# Patient Record
Sex: Male | Born: 1945 | Race: White | Hispanic: No | Marital: Single
Health system: Southern US, Community
[De-identification: ages and names within clinical notes are randomized; demographics above are authoritative.]

## PROBLEM LIST (undated history)

## (undated) DIAGNOSIS — R569 Unspecified convulsions: Secondary | ICD-10-CM

## (undated) DIAGNOSIS — N400 Enlarged prostate without lower urinary tract symptoms: Secondary | ICD-10-CM

## (undated) DIAGNOSIS — M7989 Other specified soft tissue disorders: Secondary | ICD-10-CM

## (undated) DIAGNOSIS — K219 Gastro-esophageal reflux disease without esophagitis: Secondary | ICD-10-CM

## (undated) DIAGNOSIS — F79 Unspecified intellectual disabilities: Secondary | ICD-10-CM

## (undated) HISTORY — DX: Other specified soft tissue disorders: M79.89

---

## 2004-08-09 ENCOUNTER — Ambulatory Visit: Payer: Self-pay | Admitting: Urology

## 2009-11-25 ENCOUNTER — Inpatient Hospital Stay: Payer: Self-pay | Admitting: Internal Medicine

## 2009-12-26 ENCOUNTER — Emergency Department: Payer: Self-pay | Admitting: Emergency Medicine

## 2011-07-09 ENCOUNTER — Emergency Department: Payer: Self-pay | Admitting: Emergency Medicine

## 2011-10-08 ENCOUNTER — Emergency Department: Payer: Self-pay | Admitting: Internal Medicine

## 2013-10-16 ENCOUNTER — Ambulatory Visit: Payer: Self-pay | Admitting: Podiatry

## 2013-10-22 ENCOUNTER — Encounter: Payer: Self-pay | Admitting: Podiatry

## 2013-10-23 ENCOUNTER — Ambulatory Visit (INDEPENDENT_AMBULATORY_CARE_PROVIDER_SITE_OTHER): Payer: Medicare Other | Admitting: Podiatry

## 2013-10-23 VITALS — BP 134/84 | HR 55 | Resp 16

## 2013-10-23 DIAGNOSIS — B351 Tinea unguium: Secondary | ICD-10-CM

## 2013-10-23 DIAGNOSIS — M79609 Pain in unspecified limb: Secondary | ICD-10-CM

## 2013-10-23 NOTE — Progress Notes (Signed)
Subjective:     Patient ID: Philip Wright, male   DOB: [birth year], 67 y.o.   MRN: [id number]  HPI patient presents stating I need my nails cut. They are thick and pain   Review of Systems     Objective:   Physical Exam Neurovascular status unchanged with health history same and painful nailbeds with thickness 1-5 of both feet    Assessment:     Mycotic infection with painful nailbeds 1-5 both feet    Plan:     Debridement of painful nailbeds 1-5 both feet with no iatrogenic bleeding noted

## 2014-02-19 ENCOUNTER — Ambulatory Visit (INDEPENDENT_AMBULATORY_CARE_PROVIDER_SITE_OTHER): Payer: Medicare Other | Admitting: Podiatry

## 2014-02-19 ENCOUNTER — Encounter: Payer: Self-pay | Admitting: Podiatry

## 2014-02-19 DIAGNOSIS — M79609 Pain in unspecified limb: Secondary | ICD-10-CM

## 2014-02-19 DIAGNOSIS — B351 Tinea unguium: Secondary | ICD-10-CM

## 2014-02-21 NOTE — Progress Notes (Signed)
Subjective:     Patient ID: Philip Wright, male   DOB: 07/22/1946, 68 y.o.   MRN: [id number]  HPI patient presents with caregiver with thick painful nail bed 1-5 both feet that he cannot cut do to his physical limitations   Review of Systems     Objective:   Physical Exam Neurovascular status unchanged with thick brittle painful nail bed 1-5 both feet    Assessment:     Mycotic nail infection with pain 1-5 both feet    Plan:     Debridement of painful nailbeds 1-5 both feet with no bleeding noted

## 2014-06-18 ENCOUNTER — Ambulatory Visit (INDEPENDENT_AMBULATORY_CARE_PROVIDER_SITE_OTHER): Payer: Medicare Other | Admitting: Podiatry

## 2014-06-18 DIAGNOSIS — M79609 Pain in unspecified limb: Secondary | ICD-10-CM

## 2014-06-18 DIAGNOSIS — M79673 Pain in unspecified foot: Secondary | ICD-10-CM

## 2014-06-18 DIAGNOSIS — B351 Tinea unguium: Secondary | ICD-10-CM

## 2014-06-18 NOTE — Progress Notes (Signed)
Subjective:     Patient ID: Philip Wright, male   DOB: 09/11/1946, 68 y.o.   MRN: [id number]  HPI  patient presents with thick nailbeds 1-5 both feet that he cannot cut and is with caregiver   Review of Systems      Objective:   Physical Exam  Neurovascular status unchanged with thick yellow brittle nailbeds 1-5 both feet that are painful    Assessment:     Mycotic nail infection is with pain 1-5 both feet    Plan:     Debride painful nailbeds 1-5 both feet with no iatrogenic bleeding noted      

## 2014-09-17 ENCOUNTER — Ambulatory Visit (INDEPENDENT_AMBULATORY_CARE_PROVIDER_SITE_OTHER): Payer: Medicare Other | Admitting: Podiatry

## 2014-09-17 DIAGNOSIS — M79673 Pain in unspecified foot: Secondary | ICD-10-CM

## 2014-09-17 DIAGNOSIS — B351 Tinea unguium: Secondary | ICD-10-CM

## 2014-09-17 NOTE — Progress Notes (Signed)
Subjective:     Patient ID: Philip Wright, male   DOB: 07/15/1946, 68 y.o.   MRN: [id number]  HPI  patient presents with thick nailbeds 1-5 both feet that he cannot cut and is with caregiver   Review of Systems      Objective:   Physical Exam  Neurovascular status unchanged with thick yellow brittle nailbeds 1-5 both feet that are painful    Assessment:     Mycotic nail infection is with pain 1-5 both feet    Plan:     Debride painful nailbeds 1-5 both feet with no iatrogenic bleeding noted      

## 2014-09-17 NOTE — Progress Notes (Signed)
Subjective:     Patient ID: Philip Wright, male   DOB: 01/23/1946, 68 y.o.   MRN: [id number]  HPI  patient presents with thick nailbeds 1-5 both feet that he cannot cut and is with caregiver   Review of Systems      Objective:   Physical Exam  Neurovascular status unchanged with thick yellow brittle nailbeds 1-5 both feet that are painful    Assessment:     Mycotic nail infection is with pain 1-5 both feet    Plan:     Debride painful nailbeds 1-5 both feet with no iatrogenic bleeding noted

## 2014-12-10 ENCOUNTER — Emergency Department: Payer: Self-pay | Admitting: Emergency Medicine

## 2014-12-10 LAB — URINALYSIS, COMPLETE
BILIRUBIN, UR: NEGATIVE
Blood: NEGATIVE
Glucose,UR: NEGATIVE mg/dL (ref 0–75)
Ketone: NEGATIVE
Nitrite: NEGATIVE
Ph: 5 (ref 4.5–8.0)
Protein: 30
RBC,UR: 27 /HPF (ref 0–5)
Specific Gravity: 1.015 (ref 1.003–1.030)
WBC UR: 437 /HPF (ref 0–5)

## 2014-12-10 LAB — COMPREHENSIVE METABOLIC PANEL
ALK PHOS: 56 U/L (ref 46–116)
ALT: 23 U/L (ref 14–63)
Albumin: 3.5 g/dL (ref 3.4–5.0)
Anion Gap: 4 — ABNORMAL LOW (ref 7–16)
BILIRUBIN TOTAL: 0.3 mg/dL (ref 0.2–1.0)
BUN: 15 mg/dL (ref 7–18)
CREATININE: 0.77 mg/dL (ref 0.60–1.30)
Calcium, Total: 8.9 mg/dL (ref 8.5–10.1)
Chloride: 105 mmol/L (ref 98–107)
Co2: 30 mmol/L (ref 21–32)
EGFR (Non-African Amer.): >60
GLUCOSE: 98 mg/dL (ref 65–99)
Osmolality: 278 (ref 275–301)
Potassium: 3.9 mmol/L (ref 3.5–5.1)
SGOT(AST): 28 U/L (ref 15–37)
Sodium: 139 mmol/L (ref 136–145)
TOTAL PROTEIN: 7.2 g/dL (ref 6.4–8.2)

## 2014-12-10 LAB — CBC
HCT: 38 % — AB (ref 40.0–52.0)
HGB: 13.3 g/dL (ref 13.0–18.0)
MCH: 33.9 pg (ref 26.0–34.0)
MCHC: 34.9 g/dL (ref 32.0–36.0)
MCV: 97 fL (ref 80–100)
Platelet: 244 10*3/uL (ref 150–440)
RBC: 3.92 10*6/uL — AB (ref 4.40–5.90)
RDW: 12.8 % (ref 11.5–14.5)
WBC: 5.7 10*3/uL (ref 3.8–10.6)

## 2014-12-13 LAB — URINE CULTURE

## 2014-12-17 ENCOUNTER — Other Ambulatory Visit: Payer: Medicare Other

## 2014-12-21 ENCOUNTER — Ambulatory Visit (INDEPENDENT_AMBULATORY_CARE_PROVIDER_SITE_OTHER): Payer: Medicare Other | Admitting: Podiatry

## 2014-12-21 DIAGNOSIS — B351 Tinea unguium: Secondary | ICD-10-CM

## 2014-12-21 DIAGNOSIS — M79673 Pain in unspecified foot: Secondary | ICD-10-CM

## 2014-12-21 NOTE — Progress Notes (Signed)
Subjective:     Patient ID: Philip Wright, male   DOB: 01/07/1946, 69 y.o.   MRN: [id number]  HPI  patient presents with thick nailbeds 1-5 both feet that he cannot cut and is with caregiver   Review of Systems      Objective:   Physical Exam  Neurovascular status unchanged with thick yellow brittle nailbeds 1-5 both feet that are painful    Assessment:     Mycotic nail infection is with pain 1-5 both feet    Plan:     Debride painful nailbeds 1-5 both feet with no iatrogenic bleeding noted      

## 2015-06-02 ENCOUNTER — Ambulatory Visit (INDEPENDENT_AMBULATORY_CARE_PROVIDER_SITE_OTHER): Payer: Medicare Other | Admitting: Podiatry

## 2015-06-02 DIAGNOSIS — M79673 Pain in unspecified foot: Secondary | ICD-10-CM | POA: Diagnosis not present

## 2015-06-02 DIAGNOSIS — B351 Tinea unguium: Secondary | ICD-10-CM

## 2015-06-02 NOTE — Progress Notes (Signed)
Subjective: 69 y.o. returns the office today for painful, elongated, thickened toenails which he is unable to trim himself. Denies any redness or drainage around the nails. Denies any acute changes since last appointment and no new complaints today. Denies any systemic complaints such as fevers, chills, nausea, vomiting.   Objective: Awake, alert, NAD; presents with caregiver DP/PT pulses palpable, CRT less than 3 seconds Nails hypertrophic, dystrophic, elongated, brittle, discolored 10. There is tenderness overlying the nails 1-5 bilaterally. There is no surrounding erythema or drainage along the nail sites. No open lesions or pre-ulcerative lesions are identified. No other areas of tenderness bilateral lower extremities. No overlying edema, erythema, increased warmth. No pain with calf compression, swelling, warmth, erythema.  Assessment: Patient presents with symptomatic onychomycosis  Plan: -Treatment options including alternatives, risks, complications were discussed -Nails sharply debrided 10 without complication/bleeding. -Discussed daily foot inspection. If there are any changes, to call the office immediately.  -Follow-up in 3 months or sooner if any problems are to arise. In the meantime, encouraged to call the office with any questions, concerns, changes symptoms.   Ovid Curd, DPM

## 2015-09-08 ENCOUNTER — Ambulatory Visit (INDEPENDENT_AMBULATORY_CARE_PROVIDER_SITE_OTHER): Payer: Medicare Other | Admitting: Podiatry

## 2015-09-08 DIAGNOSIS — B351 Tinea unguium: Secondary | ICD-10-CM

## 2015-09-08 DIAGNOSIS — M79673 Pain in unspecified foot: Secondary | ICD-10-CM | POA: Diagnosis not present

## 2015-09-08 NOTE — Progress Notes (Signed)
Subjective: 69 y.o. returns the office today for painful, elongated, thickened toenails which he is unable to trim himself. Denies any redness or drainage around the nails. Denies any acute changes since last appointment and no new complaints today. Denies any systemic complaints such as fevers, chills, nausea, vomiting.   Objective: NAD; presents with caregiver DP/PT pulses palpable, CRT less than 3 seconds Nails hypertrophic, dystrophic, elongated, brittle, discolored 10. There is tenderness overlying the nails 1-5 bilaterally. There is no surrounding erythema or drainage along the nail sites. No open lesions or pre-ulcerative lesions are identified. No other areas of tenderness bilateral lower extremities. No overlying edema, erythema, increased warmth. No pain with calf compression, swelling, warmth, erythema.  Assessment: Patient presents with symptomatic onychomycosis  Plan: -Treatment options including alternatives, risks, complications were discussed -Nails sharply debrided 10 without complication/bleeding. -Discussed daily foot inspection. If there are any changes, to call the office immediately.  -Follow-up in 3 months or sooner if any problems are to arise. In the meantime, encouraged to call the office with any questions, concerns, changes symptoms. 3 mo.  Ovid CurdMatthew Wagoner, DPM

## 2015-10-04 ENCOUNTER — Emergency Department: Payer: Medicare Other

## 2015-10-04 ENCOUNTER — Encounter: Payer: Self-pay | Admitting: Emergency Medicine

## 2015-10-04 ENCOUNTER — Emergency Department
Admission: EM | Admit: 2015-10-04 | Discharge: 2015-10-04 | Disposition: A | Discharge status: Home / Self Care | Payer: Medicare Other | Attending: Student | Admitting: Student

## 2015-10-04 DIAGNOSIS — Q541 Hypospadias, penile: Secondary | ICD-10-CM | POA: Diagnosis not present

## 2015-10-04 DIAGNOSIS — Z880 Allergy status to penicillin: Secondary | ICD-10-CM | POA: Insufficient documentation

## 2015-10-04 DIAGNOSIS — R109 Unspecified abdominal pain: Secondary | ICD-10-CM | POA: Diagnosis present

## 2015-10-04 DIAGNOSIS — N320 Bladder-neck obstruction: Secondary | ICD-10-CM | POA: Diagnosis not present

## 2015-10-04 DIAGNOSIS — I10 Essential (primary) hypertension: Secondary | ICD-10-CM | POA: Insufficient documentation

## 2015-10-04 DIAGNOSIS — Z79899 Other long term (current) drug therapy: Secondary | ICD-10-CM | POA: Insufficient documentation

## 2015-10-04 DIAGNOSIS — R1084 Generalized abdominal pain: Secondary | ICD-10-CM | POA: Diagnosis not present

## 2015-10-04 HISTORY — DX: Benign prostatic hyperplasia without lower urinary tract symptoms: N40.0

## 2015-10-04 HISTORY — DX: Gastro-esophageal reflux disease without esophagitis: K21.9

## 2015-10-04 HISTORY — DX: Unspecified intellectual disabilities: F79

## 2015-10-04 HISTORY — DX: Unspecified convulsions: R56.9

## 2015-10-04 LAB — URINALYSIS COMPLETE WITH MICROSCOPIC (ARMC ONLY)
BACTERIA UA: NONE SEEN
Bilirubin Urine: NEGATIVE
GLUCOSE, UA: NEGATIVE mg/dL
HGB URINE DIPSTICK: NEGATIVE
Ketones, ur: NEGATIVE mg/dL
NITRITE: NEGATIVE
PH: 5 (ref 5.0–8.0)
Protein, ur: NEGATIVE mg/dL
SPECIFIC GRAVITY, URINE: 1.019 (ref 1.005–1.030)

## 2015-10-04 LAB — COMPREHENSIVE METABOLIC PANEL
ALT: 20 U/L (ref 17–63)
ANION GAP: 7 (ref 5–15)
AST: 33 U/L (ref 15–41)
Albumin: 4.2 g/dL (ref 3.5–5.0)
Alkaline Phosphatase: 49 U/L (ref 38–126)
BUN: 16 mg/dL (ref 6–20)
CHLORIDE: 106 mmol/L (ref 101–111)
CO2: 26 mmol/L (ref 22–32)
Calcium: 8.9 mg/dL (ref 8.9–10.3)
Creatinine, Ser: 0.78 mg/dL (ref 0.61–1.24)
GFR calc non Af Amer: >60 mL/min (ref >60)
Glucose, Bld: 148 mg/dL — ABNORMAL HIGH (ref 65–99)
POTASSIUM: 3.7 mmol/L (ref 3.5–5.1)
SODIUM: 139 mmol/L (ref 135–145)
Total Bilirubin: 0.2 mg/dL — ABNORMAL LOW (ref 0.3–1.2)
Total Protein: 7 g/dL (ref 6.5–8.1)

## 2015-10-04 LAB — CBC WITH DIFFERENTIAL/PLATELET
Basophils Absolute: 0 10*3/uL (ref 0–0.1)
Basophils Relative: 1 %
EOS ABS: 0.2 10*3/uL (ref 0–0.7)
EOS PCT: 3 %
HCT: 37.3 % — ABNORMAL LOW (ref 40.0–52.0)
Hemoglobin: 12.9 g/dL — ABNORMAL LOW (ref 13.0–18.0)
LYMPHS ABS: 1.6 10*3/uL (ref 1.0–3.6)
Lymphocytes Relative: 26 %
MCH: 33.2 pg (ref 26.0–34.0)
MCHC: 34.7 g/dL (ref 32.0–36.0)
MCV: 95.6 fL (ref 80.0–100.0)
MONOS PCT: 12 %
Monocytes Absolute: 0.7 10*3/uL (ref 0.2–1.0)
Neutro Abs: 3.5 10*3/uL (ref 1.4–6.5)
Neutrophils Relative %: 58 %
PLATELETS: 212 10*3/uL (ref 150–440)
RBC: 3.9 MIL/uL — ABNORMAL LOW (ref 4.40–5.90)
RDW: 13.1 % (ref 11.5–14.5)
WBC: 6.1 10*3/uL (ref 3.8–10.6)

## 2015-10-04 LAB — PHENYTOIN LEVEL, TOTAL: Phenytoin Lvl: 14.9 ug/mL (ref 10.0–20.0)

## 2015-10-04 LAB — TROPONIN I

## 2015-10-04 LAB — LIPASE, BLOOD: Lipase: 23 U/L (ref 11–51)

## 2015-10-04 MED ORDER — BARIUM SULFATE 2.1 % PO SUSP
900.0000 mL | Freq: Once | ORAL | Status: AC
  Administered 2015-10-04: 900 mL via ORAL

## 2015-10-04 MED ORDER — SODIUM CHLORIDE 0.9 % IV BOLUS (SEPSIS)
500.0000 mL | Freq: Once | INTRAVENOUS | Status: AC
  Administered 2015-10-04: 500 mL via INTRAVENOUS

## 2015-10-04 MED ORDER — IOHEXOL 240 MG/ML SOLN: 25.0000 mL | Freq: Once | INTRAMUSCULAR | Status: DC | PRN

## 2015-10-04 NOTE — ED Notes (Signed)
Pt caregivers state his last void today is unknown.

## 2015-10-04 NOTE — ED Notes (Signed)
Pt discharged to group home with caregivers.  Pt went home with [id number] foley.  Discharge teaching done with caregivers, including proper foley care.  Voiced understanding.  No questions or concerns at this time.  All items with pt upon discharge.  No items left in room.

## 2015-10-04 NOTE — ED Notes (Signed)
Bladder scan approximately 374 mL.

## 2015-10-04 NOTE — ED Notes (Signed)
Pt here via [facility] with c/o LLQ abdominal pain. EMS reports pt with history of kidney stones, pt poor historian due to mental retardation. Pt able to answer yes and no questions, pt guarding left side of abdomen.

## 2015-10-04 NOTE — ED Notes (Signed)
In and out catheter returned approximately 400 mL.

## 2015-10-04 NOTE — ED Provider Notes (Signed)
[facility] Emergency Department Provider Note  ____________________________________________  Time seen: Approximately 3:09 PM  I have reviewed the triage vital signs and the nursing notes.   HISTORY  Chief Complaint Abdominal Pain  Caveat-history of present illness and review of systems limited secondary to the patient's severe intellectual disability. Information obtained from patient's family and caregivers who are at bedside.  HPI Philip Wright is a 69 y.o. male with history of severe intellectual disability, seizures, HTN, enlarged prostate, nephrolithiasis, frequent urinary tract infections who presents for evaluation of perceived left sided abdominal pain today. Caregivers report that the patient was at a day care program when he developed left-sided abdominal pain and was doubled over in pain. He has had no vomiting or fevers, no cough, no recent falls. Up until today, he had been in his usual state of health.   Past Medical History  Diagnosis Date  . Bilateral swelling of feet   . Mental retardation   . Seizures (HCC)   . Esophageal reflux   . Enlarged prostate     There are no active problems to display for this patient.   No past surgical history on file.  Current Outpatient Rx  Name  Route  Sig  Dispense  Refill  . acetaminophen (TYLENOL) 325 MG tablet   Oral   Take 325 mg by mouth every 6 (six) hours as needed for mild pain, moderate pain, fever or headache.          . ipratropium (ATROVENT) 0.03 % nasal spray   Each Nare   Place 2 sprays into both nostrils 2 (two) times daily.         Marland Kitchen lovastatin (MEVACOR) 20 MG tablet   Oral   Take 20 mg by mouth daily with supper.         . Multiple Vitamins-Minerals (SUPER THERA VITE M PO)   Oral   Take 1 tablet by mouth daily.          . nitrofurantoin, macrocrystal-monohydrate, (MACROBID) 100 MG capsule   Oral   Take 100 mg by mouth daily.         Marland Kitchen omeprazole (PRILOSEC) 20 MG  capsule   Oral   Take 20 mg by mouth every morning.          . phenytoin (DILANTIN) 100 MG ER capsule   Oral   Take 300 mg by mouth at bedtime.          . propranolol (INDERAL) 40 MG tablet   Oral   Take 40 mg by mouth 3 (three) times daily.         . Skin Protectants, Misc. (ALOE VESTA PROTECTIVE EX)   Apply externally   Apply 1 application topically daily.         . tamsulosin (FLOMAX) 0.4 MG CAPS capsule   Oral   Take 0.4 mg by mouth every evening.          . Vitamin D, Ergocalciferol, (DRISDOL) 50000 UNITS CAPS capsule   Oral   Take 50,000 Units by mouth every 14 (fourteen) days. In the evening On Friday           Allergies Amoxicillin; Furoxone; Iodine; Shellfish allergy; and Zantac  No family history on file.  Social History Social History  Substance Use Topics  . Smoking status: Unknown If Ever Smoked  . Smokeless tobacco: None  . Alcohol Use: No    Review of Systems Constitutional: No fever/chills Gastrointestinal: + abdominal pain.  No nausea, no vomiting.  No diarrhea.    Caveat-history of present illness and review of systems limited secondary to the patient's severe intellectual disability. Information obtained from patient's family and caregivers who are at bedside. ____________________________________________   PHYSICAL EXAM:  VITAL SIGNS: ED Triage Vitals  Enc Vitals Group     BP 10/04/15 1448 129/67 mmHg     Pulse Rate 10/04/15 1448 72     Resp 10/04/15 1448 20     Temp 10/04/15 1448 98.1 F (36.7 C)     Temp Source 10/04/15 1448 Oral     SpO2 10/04/15 1448 95 %     Weight 10/04/15 1448 170 lb (77.111 kg)     Height --      Head Cir --      Peak Flow --      Pain Score --      Pain Loc --      Pain Edu? --      Excl. in GC? --     Constitutional: Alert, smiling, and dorsal disability limits the history taking but the patient is able to follow commands and participate in the physical examination. He is well-appearing  and in no acute distress. Eyes: Conjunctivae are normal. PERRL. EOMI. Head: Atraumatic. Nose: No congestion/rhinnorhea. Mouth/Throat: Mucous membranes are moist.  Oropharynx non-erythematous. Neck: No stridor.   Cardiovascular: Normal rate, regular rhythm. Grossly normal heart sounds.  Good peripheral circulation. Respiratory: Normal respiratory effort.  No retractions. Lungs CTAB. Gastrointestinal: Soft with mild diffuse tenderness as well as guarding. No CVA tenderness. Genitourinary: + penile hypospadias Musculoskeletal: No lower extremity tenderness nor edema.  No joint effusions. Neurologic:  Normal speech and language given considering intellecutal disability. No gross focal neurologic deficits are appreciated. Skin:  Skin is warm, dry and intact. No rash noted. Psychiatric: Mood and affect are normal. Speech and behavior are normal.  ____________________________________________   LABS (all labs ordered are listed, but only abnormal results are displayed)  Labs Reviewed  CBC WITH DIFFERENTIAL/PLATELET - Abnormal; Notable for the following:    RBC 3.90 (*)    Hemoglobin 12.9 (*)    HCT 37.3 (*)    All other components within normal limits  COMPREHENSIVE METABOLIC PANEL - Abnormal; Notable for the following:    Glucose, Bld 148 (*)    Total Bilirubin 0.2 (*)    All other components within normal limits  URINALYSIS COMPLETEWITH MICROSCOPIC ([facility] ONLY) - Abnormal; Notable for the following:    Color, Urine YELLOW (*)    APPearance HAZY (*)    Leukocytes, UA TRACE (*)    Squamous Epithelial / LPF 0-5 (*)    All other components within normal limits  URINE CULTURE  LIPASE, BLOOD  TROPONIN I  PHENYTOIN LEVEL, TOTAL   ____________________________________________  EKG  ED ECG REPORT I, Gayla Doss, the attending physician, personally viewed and interpreted this ECG.   Date: 10/04/2015  EKG Time: 14:50  Rate: 76  Rhythm: normal sinus rhythm  Axis: normal   Intervals:nonspecific intraventricular conduction delay  ST&T Change: No acute ST elevation.   ____________________________________________  RADIOLOGY  CXR IMPRESSION: Moderately limited study showing heart size that is upper normal. No acute findings appreciated   CT  Abdomen and Pelvis IMPRESSION: 1. No acute finding. 2. Marked bladder wall thickening, favor chronic outlet obstruction. Please correlate with urinalysis. 3. Extensive colonic diverticulosis. ____________________________________________   PROCEDURES  Procedure(s) performed: None  Critical Care performed: No  ____________________________________________   INITIAL IMPRESSION /  ASSESSMENT AND PLAN / ED COURSE  Pertinent labs & imaging results that were available during my care of the patient were reviewed by me and considered in my medical decision making (see chart for details).  Philip Wright is a 69 y.o. male with history of severe intellectual disability, seizures, HTN, enlarged prostate, nephrolithiasis, frequent urinary tract infections who presents for evaluation of perceived left sided abdominal pain today. On exam, he is nontoxic appearing and in no acute distress. Vital signs stable, he is afebrile. He does have some degree of tenderness and guarding throughout the abdomen. Plan for abdominal pain labs, urinalysis, will bladder scan, likely obtain CT of the abdomen and pelvis. We'll reassess for disposition.  ----------------------------------------- 8:31 PM on 10/04/2015 ----------------------------------------- Patient continues to appear well, he has had no recurrence of pain since arrival to the emergency department. Labs reviewed and are notable for mild anemia with hemoglobin 12.9. Normal CMP and lipase. Negative troponin, and normal Dilantin levels. Urinalysis with multiple white blood cells however no bacteria. CT scan shows bladder wall thickening which favors chronic outlet obstruction.  Discussed with his caregiver at bedside/guardian that we could preferentially place a Foley catheter as he may be expensive bladder outlet obstruction. In the catheter performed earlier in the day revealed less than 400 cc in the bladder. His guardian and caregivers at bedside are hesitant for this to be done as they state that he will likely pull the catheter out himself due to discomfort. Discussed that we would attempt trial of void in the ER and if he is able to void, send him home with expedient urology follow-up. Will not treat for urinary tract infection but we'll send urine culture.  ----------------------------------------- 9:17 PM on 10/04/2015 ----------------------------------------- Unable to void. Family has agreed to Foley. We'll place Foley and DC with return precautions and close urology follow-up. Discussed return precautions with care providers and guardian and all are comfortable with the discharge plan.  ____________________________________________   FINAL CLINICAL IMPRESSION(S) / ED DIAGNOSES  Final diagnoses:  Generalized abdominal pain  Bladder outlet obstruction      Gayla DossEryka A Milton Streicher, MD 10/05/15 [phone]

## 2015-10-08 LAB — URINE CULTURE

## 2015-11-01 ENCOUNTER — Emergency Department
Admission: EM | Admit: 2015-11-01 | Discharge: 2015-11-01 | Disposition: A | Discharge status: Home / Self Care | Payer: Medicare Other | Attending: Emergency Medicine | Admitting: Emergency Medicine

## 2015-11-01 ENCOUNTER — Emergency Department: Payer: Medicare Other

## 2015-11-01 DIAGNOSIS — A084 Viral intestinal infection, unspecified: Secondary | ICD-10-CM

## 2015-11-01 DIAGNOSIS — Z79899 Other long term (current) drug therapy: Secondary | ICD-10-CM | POA: Insufficient documentation

## 2015-11-01 DIAGNOSIS — J029 Acute pharyngitis, unspecified: Secondary | ICD-10-CM | POA: Insufficient documentation

## 2015-11-01 DIAGNOSIS — Z880 Allergy status to penicillin: Secondary | ICD-10-CM | POA: Insufficient documentation

## 2015-11-01 DIAGNOSIS — R1031 Right lower quadrant pain: Secondary | ICD-10-CM | POA: Diagnosis present

## 2015-11-01 DIAGNOSIS — R05 Cough: Secondary | ICD-10-CM | POA: Diagnosis not present

## 2015-11-01 LAB — COMPREHENSIVE METABOLIC PANEL
ALBUMIN: 4.2 g/dL (ref 3.5–5.0)
ALK PHOS: 55 U/L (ref 38–126)
ALT: 16 U/L — ABNORMAL LOW (ref 17–63)
ANION GAP: 8 (ref 5–15)
AST: 26 U/L (ref 15–41)
BILIRUBIN TOTAL: 0.6 mg/dL (ref 0.3–1.2)
BUN: 13 mg/dL (ref 6–20)
CHLORIDE: 101 mmol/L (ref 101–111)
CO2: 23 mmol/L (ref 22–32)
Calcium: 9.1 mg/dL (ref 8.9–10.3)
Creatinine, Ser: 0.6 mg/dL — ABNORMAL LOW (ref 0.61–1.24)
GFR calc non Af Amer: >60 mL/min (ref >60)
Glucose, Bld: 124 mg/dL — ABNORMAL HIGH (ref 65–99)
POTASSIUM: 3.9 mmol/L (ref 3.5–5.1)
Sodium: 132 mmol/L — ABNORMAL LOW (ref 135–145)
TOTAL PROTEIN: 7 g/dL (ref 6.5–8.1)

## 2015-11-01 LAB — LIPASE, BLOOD: Lipase: 14 U/L (ref 11–51)

## 2015-11-01 LAB — CBC
HEMATOCRIT: 34.6 % — AB (ref 40.0–52.0)
HEMOGLOBIN: 12.3 g/dL — AB (ref 13.0–18.0)
MCH: 33.8 pg (ref 26.0–34.0)
MCHC: 35.6 g/dL (ref 32.0–36.0)
MCV: 94.7 fL (ref 80.0–100.0)
Platelets: 216 10*3/uL (ref 150–440)
RBC: 3.65 MIL/uL — AB (ref 4.40–5.90)
RDW: 13.2 % (ref 11.5–14.5)
WBC: 5.9 10*3/uL (ref 3.8–10.6)

## 2015-11-01 LAB — URINALYSIS COMPLETE WITH MICROSCOPIC (ARMC ONLY)
Bacteria, UA: NONE SEEN
Bilirubin Urine: NEGATIVE
Glucose, UA: NEGATIVE mg/dL
HGB URINE DIPSTICK: NEGATIVE
LEUKOCYTES UA: NEGATIVE
NITRITE: NEGATIVE
PH: 6 (ref 5.0–8.0)
PROTEIN: NEGATIVE mg/dL
SPECIFIC GRAVITY, URINE: 1.012 (ref 1.005–1.030)

## 2015-11-01 MED ORDER — DICYCLOMINE HCL 20 MG PO TABS: 20.0000 mg | ORAL_TABLET | Freq: Three times a day (TID) | ORAL | Status: AC | PRN

## 2015-11-01 MED ORDER — HYDROMORPHONE HCL 1 MG/ML IJ SOLN
0.5000 mg | Freq: Once | INTRAMUSCULAR | Status: AC
  Administered 2015-11-01: 0.5 mg via INTRAVENOUS
  Filled 2015-11-01: qty 1

## 2015-11-01 MED ORDER — SODIUM CHLORIDE 0.9 % IV BOLUS (SEPSIS)
1000.0000 mL | Freq: Once | INTRAVENOUS | Status: AC
  Administered 2015-11-01: 1000 mL via INTRAVENOUS

## 2015-11-01 MED ORDER — BARIUM SULFATE 2.1 % PO SUSP
900.0000 mL | Freq: Once | ORAL | Status: AC
  Administered 2015-11-01: 900 mL via ORAL

## 2015-11-01 MED ORDER — ONDANSETRON 8 MG PO TBDP: 8.0000 mg | ORAL_TABLET | Freq: Three times a day (TID) | ORAL | Status: AC | PRN

## 2015-11-01 NOTE — ED Provider Notes (Signed)
-----------------------------------------   11:07 PM on 11/01/2015 -----------------------------------------   Blood pressure 142/77, pulse 72, temperature 97.9 F (36.6 C), temperature source Oral, resp. rate 18, height 5\' 7"  (1.702 m), weight 195 lb (88.451 kg), SpO2 96 %.  Assuming care from Dr. Scotty CourtStafford.  In short, Philip Wright is a 69 y.o. male with a chief complaint of Abdominal Pain; Emesis; and Diarrhea .  Refer to the original H&P for additional details.  The current plan of care is to follow the patient's abdominal CT results. There appears to be no acute findings. The plan is to discharge the patient for viral gastroenteritis EXAM: CT ABDOMEN AND PELVIS WITHOUT CONTRAST  TECHNIQUE: Multidetector CT imaging of the abdomen and pelvis was performed following the standard protocol without IV contrast.  COMPARISON: 10/04/2015  FINDINGS: Lung bases demonstrate some mild atelectatic changes without focal infiltrate.  The liver, gallbladder, spleen, adrenal glands and pancreas are within normal limits. The kidneys are well visualized bilaterally and reveal some cystic changes. No obstructive changes are seen. The ureters are within normal limits. No renal calculi are noted.  The bladder is well distended. No pelvic mass lesion is seen. No inflammatory changes to suggest appendicitis are noted. Aortoiliac calcifications are seen without aneurysmal dilatation. Scattered diverticular changes are noted. Bony structures are stable in appearance from the prior study.  IMPRESSION: No significant interval change from the prior exam is noted. No acute abnormality is seen.   Philip MoccasinBrian S Jaziah Goeller, MD 11/01/15 [phone]

## 2015-11-01 NOTE — Discharge Instructions (Signed)

## 2015-11-01 NOTE — ED Provider Notes (Signed)
[facility] Emergency Department Provider Note  ____________________________________________  Time seen: 8:05 PM  I have reviewed the triage vital signs and the nursing notes.   HISTORY  Chief Complaint Abdominal Pain; Emesis; and Diarrhea    HPI Philip Wright is a 69 y.o. male is brought to the ED for nausea vomiting and diarrhea that started today along with right-sided abdominal pain. The patient is holding his right flank and right lower quadrant and grimacing in pain. He is nonverbal at baseline due to an intellectual disability. He presents with a representative from his group home where he normally does during the day. He reports that they've had an outbreak of viral illnesses in the group home recently and that there've been several cases of URI as well as vomiting and diarrhea symptoms. This patient had a URI with cough and runny nose a week ago and just as the symptoms seem to be resolving he developed the abdominal pain nausea vomiting and diarrhea today. He's also had decreased oral intake. No known surgeries.  Has a history of enlarged prostate and urinary tract infection   Past Medical History  Diagnosis Date  . Bilateral swelling of feet   . Mental retardation   . Seizures (HCC)   . Esophageal reflux   . Enlarged prostate      There are no active problems to display for this patient.    No past surgical history on file.   Current Outpatient Rx  Name  Route  Sig  Dispense  Refill  . acetaminophen (TYLENOL) 325 MG tablet   Oral   Take 325 mg by mouth every 6 (six) hours as needed for mild pain, moderate pain, fever or headache.          . ipratropium (ATROVENT) 0.03 % nasal spray   Each Nare   Place 2 sprays into both nostrils 2 (two) times daily.         Marland Kitchen lovastatin (MEVACOR) 20 MG tablet   Oral   Take 20 mg by mouth daily with supper.         . Multiple Vitamins-Minerals (SUPER THERA VITE M PO)   Oral   Take 1 tablet  by mouth daily.          . nitrofurantoin, macrocrystal-monohydrate, (MACROBID) 100 MG capsule   Oral   Take 100 mg by mouth daily.         Marland Kitchen omeprazole (PRILOSEC) 20 MG capsule   Oral   Take 20 mg by mouth every morning.          . phenytoin (DILANTIN) 100 MG ER capsule   Oral   Take 300 mg by mouth at bedtime.          . propranolol (INDERAL) 40 MG tablet   Oral   Take 40 mg by mouth 3 (three) times daily.         . Skin Protectants, Misc. (ALOE VESTA PROTECTIVE EX)   Apply externally   Apply 1 application topically daily.         . tamsulosin (FLOMAX) 0.4 MG CAPS capsule   Oral   Take 0.4 mg by mouth every evening.          . Vitamin D, Ergocalciferol, (DRISDOL) 50000 UNITS CAPS capsule   Oral   Take 50,000 Units by mouth every 14 (fourteen) days. In the evening On Friday         . dicyclomine (BENTYL) 20 MG tablet   Oral  Take 1 tablet (20 mg total) by mouth 3 (three) times daily as needed for spasms.   30 tablet   0   . ondansetron (ZOFRAN ODT) 8 MG disintegrating tablet   Oral   Take 1 tablet (8 mg total) by mouth every 8 (eight) hours as needed for nausea or vomiting.   20 tablet   0      Allergies Amoxicillin; Furoxone; Iodine; Shellfish allergy; and Zantac   No family history on file.  Social History Social History  Substance Use Topics  . Smoking status: Unknown If Ever Smoked  . Smokeless tobacco: None  . Alcohol Use: No    Review of Systems  Constitutional:   No fever or chills. No weight changes Eyes:   No blurry vision or double vision.  ENT:   Positive sore throat. Cardiovascular:   No chest pain. Respiratory:   No dyspnea positive cough. Gastrointestinal:   Positive for abdominal pain, with vomiting and diarrhea.  No BRBPR or melena. Genitourinary:   Negative for dysuria, urinary retention, bloody urine, or difficulty urinating. Musculoskeletal:   Negative for back pain. No joint swelling or pain. Skin:   Negative  for rash. Neurological:   Negative for headaches, focal weakness or numbness. Psychiatric:  No anxiety or depression.   Endocrine:  No hot/cold intolerance, changes in energy, or sleep difficulty.  10-point ROS otherwise negative.  ____________________________________________   PHYSICAL EXAM:  VITAL SIGNS: ED Triage Vitals  Enc Vitals Group     BP 11/01/15 1622 142/77 mmHg     Pulse Rate 11/01/15 1622 72     Resp 11/01/15 1622 18     Temp 11/01/15 1622 97.9 F (36.6 C)     Temp Source 11/01/15 1622 Oral     SpO2 11/01/15 1622 96 %     Weight 11/01/15 1622 195 lb (88.451 kg)     Height 11/01/15 1622 5\' 7"  (1.702 m)     Head Cir --      Peak Flow --      Pain Score --      Pain Loc --      Pain Edu? --      Excl. in GC? --     Vital signs reviewed, nursing assessments reviewed.   Constitutional:   Alert , baseline mental status. Well appearing and in no distress. Eyes:   No scleral icterus. No conjunctival pallor. PERRL. EOMI ENT   Head:   Normocephalic and atraumatic.   Nose:   No congestion/rhinnorhea. No septal hematoma   Mouth/Throat:   Dry mucous membranes, no pharyngeal erythema. No peritonsillar mass. No uvula shift.   Neck:   No stridor. No SubQ emphysema. No meningismus. Hematological/Lymphatic/Immunilogical:   No cervical lymphadenopathy. Cardiovascular:   RRR. Normal and symmetric distal pulses are present in all extremities. No murmurs, rubs, or gallops. Respiratory:   Normal respiratory effort without tachypnea nor retractions. Breath sounds are clear and equal bilaterally. No wheezes/rales/rhonchi. Gastrointestinal:   Soft with bilateral lower quadrant tenderness.. No distention. There is no CVA tenderness.  No rebound, rigidity, or guarding. Genitourinary:   deferred Musculoskeletal:   Nontender with normal range of motion in all extremities. No joint effusions.  No lower extremity tenderness.  No edema. Neurologic:   Baseline speech was due  to his unintelligible and repetitive.  CN 2-10 normal. Motor grossly intact. No gross focal neurologic deficits are appreciated.  Skin:    Skin is warm, dry and intact. No rash noted.  No  petechiae, purpura, or bullae. ____________________________________________    LABS (pertinent positives/negatives) (all labs ordered are listed, but only abnormal results are displayed) Labs Reviewed  COMPREHENSIVE METABOLIC PANEL - Abnormal; Notable for the following:    Sodium 132 (*)    Glucose, Bld 124 (*)    Creatinine, Ser 0.60 (*)    ALT 16 (*)    All other components within normal limits  CBC - Abnormal; Notable for the following:    RBC 3.65 (*)    Hemoglobin 12.3 (*)    HCT 34.6 (*)    All other components within normal limits  LIPASE, BLOOD  URINALYSIS COMPLETEWITH MICROSCOPIC ([facility] ONLY)   ____________________________________________   EKG    ____________________________________________    RADIOLOGY  CT abdomen and pelvis pending  ____________________________________________   PROCEDURES   ____________________________________________   INITIAL IMPRESSION / ASSESSMENT AND PLAN / ED COURSE  Pertinent labs & imaging results that were available during my care of the patient were reviewed by me and considered in my medical decision making (see chart for details).  Patient presents with vomiting diarrhea and abdominal pain which is most likely a viral gastroenteritis especially given the recent outbreak of similar symptoms in the group home., Because the patient is nonverbal at baseline with an inspectional disability we have very limited ability to collect historical information from him, will need to be very cautious regarding workup. We'll proceed with urinalysis and CT of the abdomen and pelvis. If the workup does not reveal any severe abnormalities, the patient will be suitable for discharge home and follow-up with primary care with supportive care. Vital signs are  unremarkable. ----------------------------------------- 10:26 PM on 11/01/2015 -----------------------------------------  Still pending CT and urinalysis. Care of the patient signed out to Dr. Huel Cote to follow-up on results.    ____________________________________________   FINAL CLINICAL IMPRESSION(S) / ED DIAGNOSES  Final diagnoses:  Viral gastroenteritis      Sharman Cheek, MD 11/01/15 2226

## 2015-11-01 NOTE — ED Notes (Signed)
Pt here with staff from [facility] with c/o pt having N/V/D today with c/o right sided abd pain.. Pt is confined to a wheelchair and is none verbal at baseline.Janene Madeira. State he has a hx of UTI in the past.. States last week he had a day with vomiting then got better.

## 2015-11-06 DEATH — deceased

## 2015-12-08 ENCOUNTER — Ambulatory Visit: Payer: Medicare Other | Admitting: Podiatry

## 2015-12-08 ENCOUNTER — Ambulatory Visit: Payer: Medicare Other

## 2016-07-18 IMAGING — CT CT ABD-PELV W/O CM
2 of 4 series · 16 of 46 positions shown, 18 images · non-contrast
Comparison: 12/26/2009

CLINICAL DATA: Left-sided abdominal pain.  Contrast allergy

EXAM:
CT ABDOMEN AND PELVIS WITHOUT CONTRAST
TECHNIQUE: Multidetector CT imaging of the abdomen and pelvis was performed
following the standard protocol without IV contrast.

[Series 2: routine abd pel wo · axial · 0.79mm/px · z∈[-1053,-583]mm · 13 of 104 slices shown, 15 images]
[im 5/104  soft-tissue]
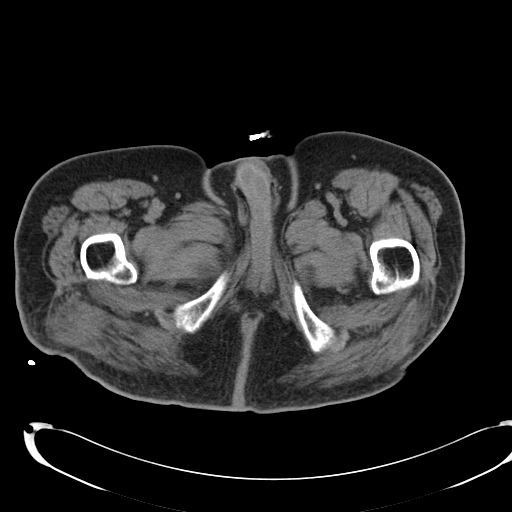
[im 5/104  bone]
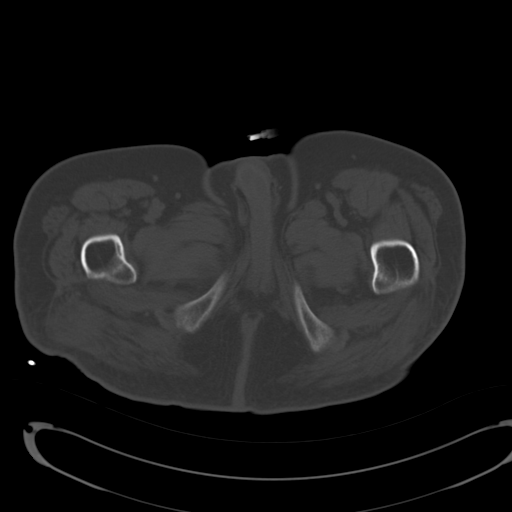
[im 14/104  soft-tissue]
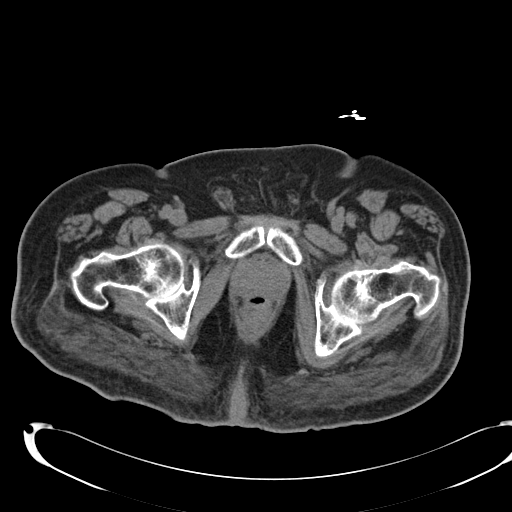
[im 23/104  soft-tissue]
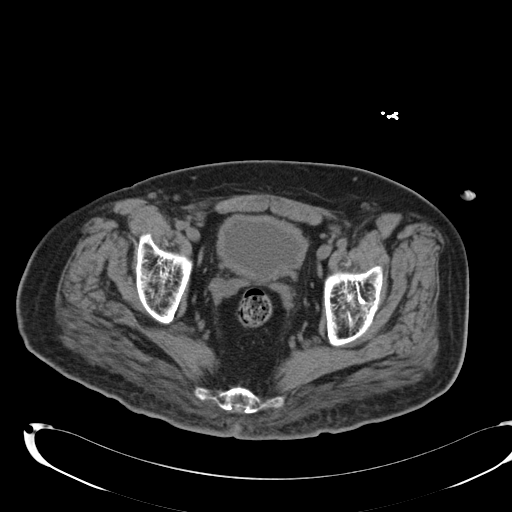
[im 27/104  soft-tissue]
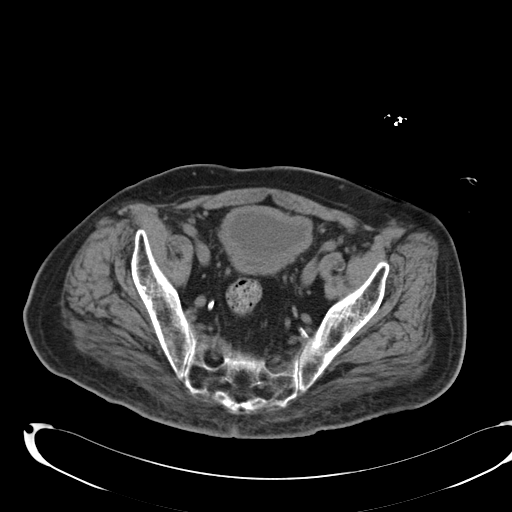
[im 36/104  soft-tissue]
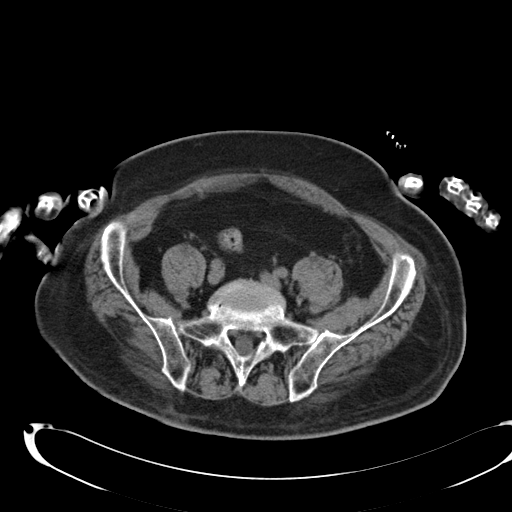
[im 45/104  soft-tissue]
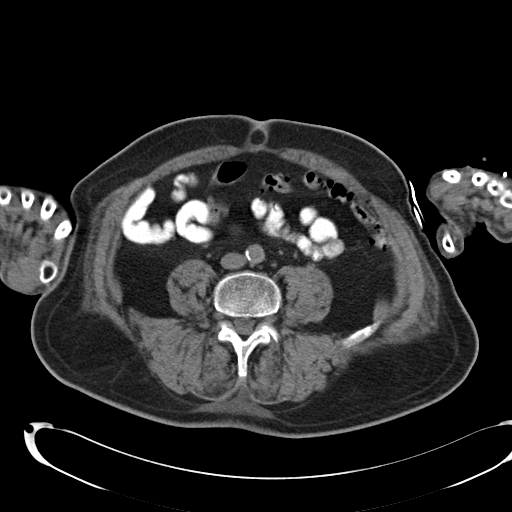
[im 54/104  soft-tissue]
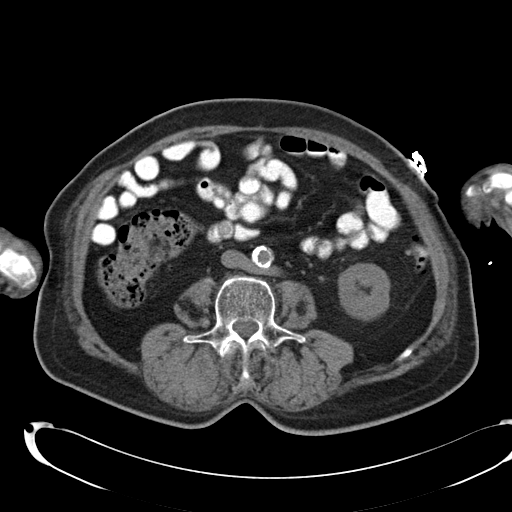
[im 59/104  soft-tissue]
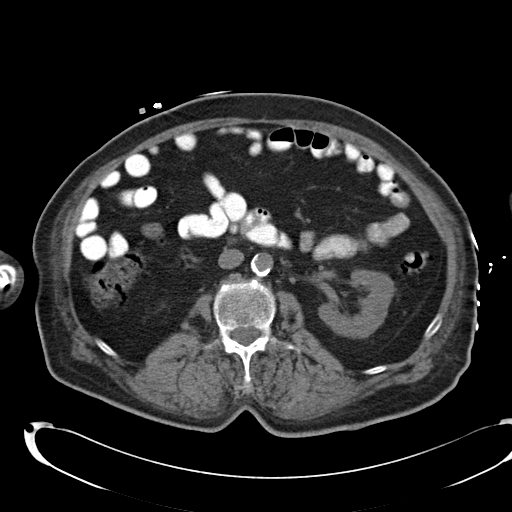
[im 68/104  soft-tissue]
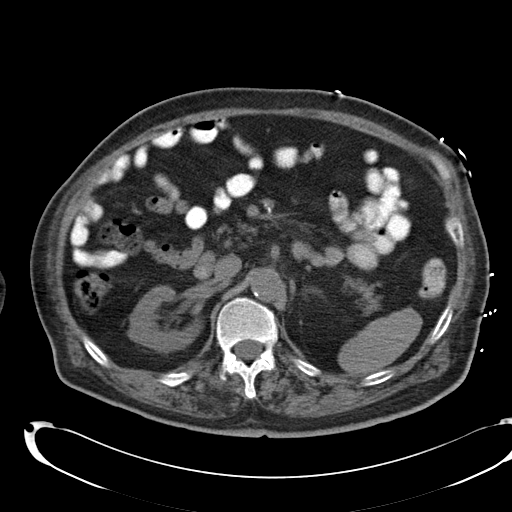
[im 68/104  bone]
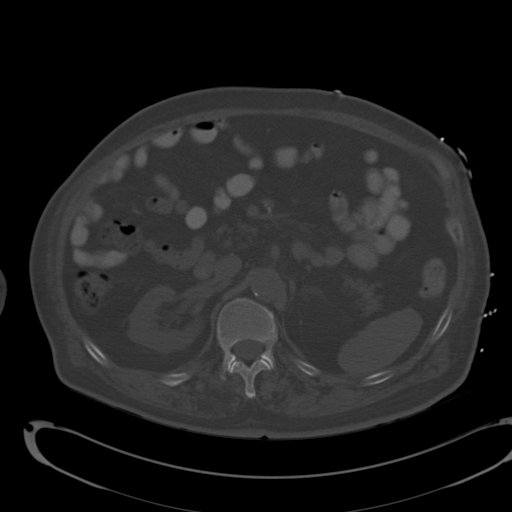
[im 77/104  soft-tissue]
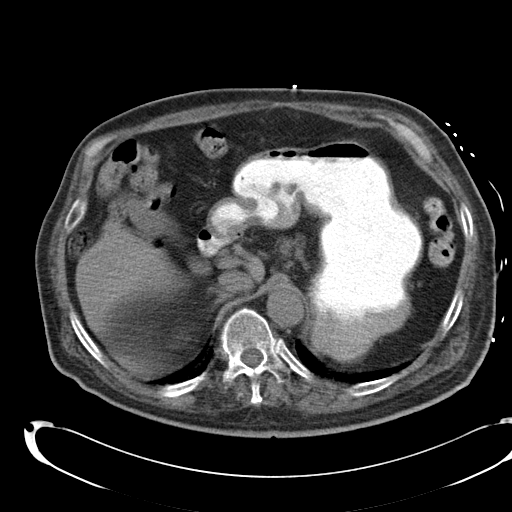
[im 81/104  soft-tissue]
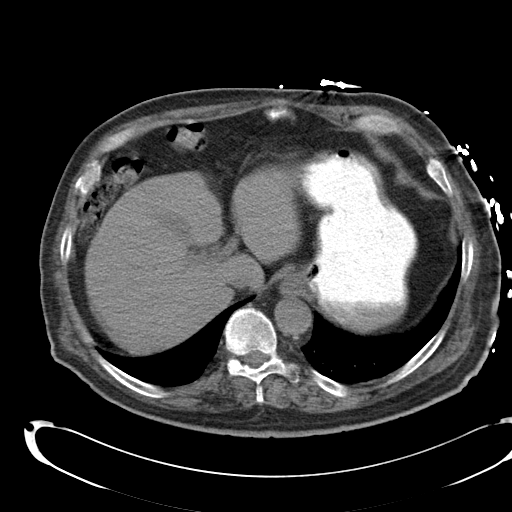
[im 90/104  soft-tissue]
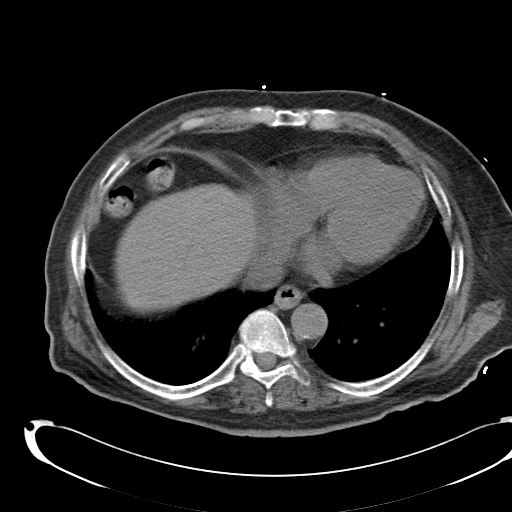
[im 99/104  soft-tissue]
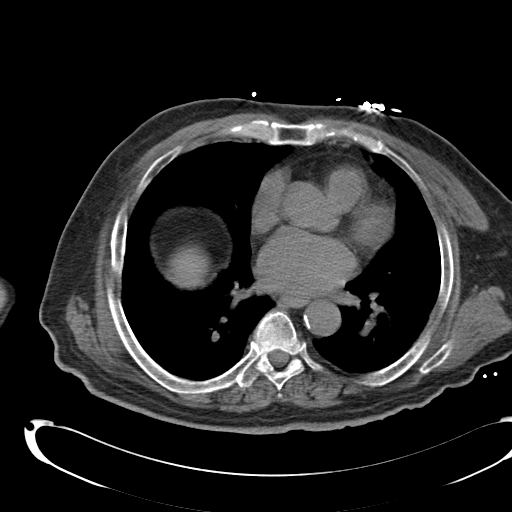

[Series 5: cor routine abd pel wo · coronal · 0.69mm/px · 3 of 132 slices shown]
[im 44/132  soft-tissue]
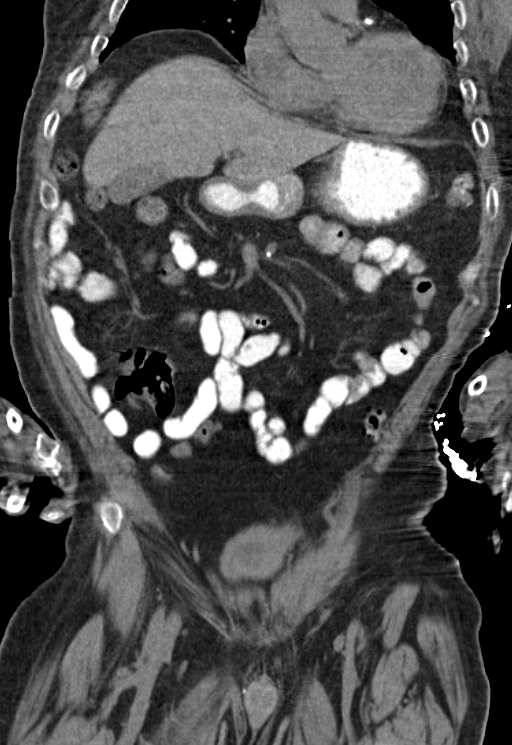
[im 59/132  soft-tissue]
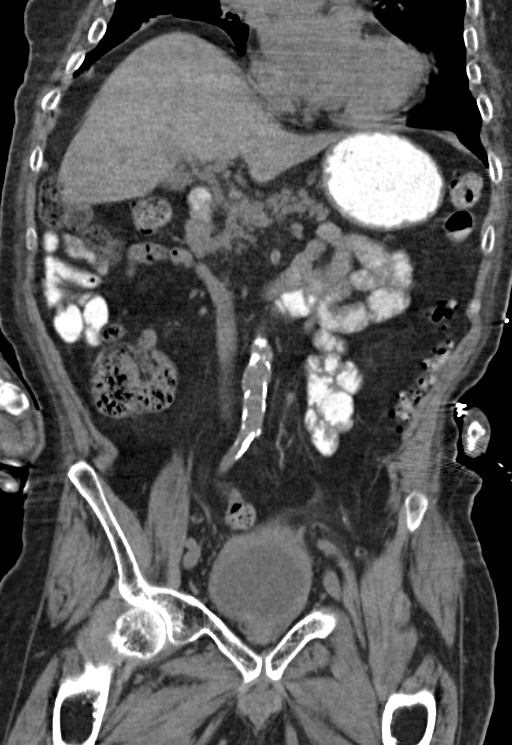
[im 73/132  soft-tissue]
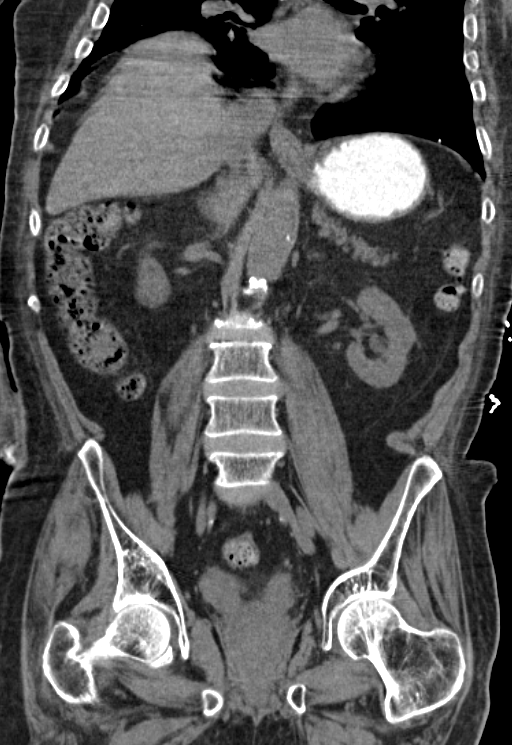

[16 of 46 positions shown; findings below may reference images not displayed]

FINDINGS: Lower chest and abdominal wall: Subcutaneous reticulation the level
buttocks, likely pressure related. No erosive changes or soft tissue
gas.

Extensive coronary atherosclerosis. Calcified lower mediastinal
lymph node and calcified lower lobe pulmonary nodules.

Hepatobiliary: No focal liver abnormality.No evidence of biliary
obstruction or stone.

Pancreas: Unremarkable.

Spleen: Unremarkable.

Adrenals/Urinary Tract: Negative adrenals. No hydronephrosis or
stone. Chronic small left renal cysts. There is marked
circumferential bladder wall thickening with possible cellules. No
surrounding inflammatory change.

Reproductive:Prostate enlargement with mild bladder base uplifting,
presumed cause of urinary bladder finding.

Stomach/Bowel: No obstruction. No appendicitis. Extensive colonic
diverticulosis.

Vascular/Lymphatic: No acute vascular abnormality. Extensive aortic
and branch vessel atherosclerosis. No mass or adenopathy.

Peritoneal: No ascites or pneumoperitoneum.

Musculoskeletal: No acute abnormalities.
IMPRESSION: 1. No acute finding.
2. Marked bladder wall thickening, favor chronic outlet obstruction.
Please correlate with urinalysis.
3. Extensive colonic diverticulosis.
